# Patient Record
Sex: Male | Born: 1980 | Race: White | Hispanic: No | Marital: Single | Smoking: Current every day smoker
Health system: Southern US, Community
[De-identification: ages and names within clinical notes are randomized; demographics above are authoritative.]

## PROBLEM LIST (undated history)

## (undated) ENCOUNTER — Emergency Department (HOSPITAL_COMMUNITY): Admission: EM | Payer: Self-pay | Source: Home / Self Care

## (undated) DIAGNOSIS — K219 Gastro-esophageal reflux disease without esophagitis: Secondary | ICD-10-CM

## (undated) HISTORY — PX: WRIST SURGERY: SHX841

---
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
## 2006-10-16 ENCOUNTER — Emergency Department (HOSPITAL_COMMUNITY): Admission: EM | Admit: 2006-10-16 | Discharge: 2006-10-17 | Payer: Self-pay | Admitting: Emergency Medicine

## 2008-04-02 ENCOUNTER — Ambulatory Visit (HOSPITAL_COMMUNITY): Admission: RE | Admit: 2008-04-02 | Discharge: 2008-04-02 | Payer: Self-pay | Admitting: Unknown Physician Specialty

## 2010-08-26 ENCOUNTER — Emergency Department (HOSPITAL_COMMUNITY)
Admission: EM | Admit: 2010-08-26 | Discharge: 2010-08-26 | Disposition: A | Discharge status: Home / Self Care | Payer: Self-pay | Attending: Emergency Medicine | Admitting: Emergency Medicine

## 2010-08-26 DIAGNOSIS — R109 Unspecified abdominal pain: Secondary | ICD-10-CM | POA: Insufficient documentation

## 2014-01-06 ENCOUNTER — Emergency Department (HOSPITAL_COMMUNITY)
Admission: EM | Admit: 2014-01-06 | Discharge: 2014-01-06 | Disposition: A | Discharge status: Home / Self Care | Payer: Self-pay | Attending: Emergency Medicine | Admitting: Emergency Medicine

## 2014-01-06 ENCOUNTER — Encounter (HOSPITAL_COMMUNITY): Payer: Self-pay | Admitting: Emergency Medicine

## 2014-01-06 DIAGNOSIS — K047 Periapical abscess without sinus: Secondary | ICD-10-CM | POA: Insufficient documentation

## 2014-01-06 DIAGNOSIS — Z792 Long term (current) use of antibiotics: Secondary | ICD-10-CM | POA: Insufficient documentation

## 2014-01-06 DIAGNOSIS — K029 Dental caries, unspecified: Secondary | ICD-10-CM | POA: Insufficient documentation

## 2014-01-06 DIAGNOSIS — Z720 Tobacco use: Secondary | ICD-10-CM | POA: Insufficient documentation

## 2014-01-06 MED ORDER — HYDROCODONE-ACETAMINOPHEN 5-325 MG PO TABS: 1.0000 | ORAL_TABLET | ORAL | Status: DC | PRN

## 2014-01-06 MED ORDER — PENICILLIN V POTASSIUM 500 MG PO TABS: 500.0000 mg | ORAL_TABLET | Freq: Four times a day (QID) | ORAL | Status: DC

## 2014-01-06 NOTE — ED Notes (Signed)
Pt has had off and on abscess from 10 years, states pain got worse last night. Abscess to lower left mouth.

## 2014-01-06 NOTE — Discharge Instructions (Signed)
Take the prescribed medication as directed.  Use caution when driving, vicodin may make you drowsy. Follow-up with dentist this week as scheduled. Return to the ED for new or worsening symptoms.

## 2014-01-06 NOTE — ED Provider Notes (Signed)
CSN: [id number]     Arrival date & time 01/06/14  1631 History  This chart was scribed for non-physician practitioner, Sharilyn SitesLisa Brittan Mapel, PA-C working with Mirian MoMatthew Gentry, MD by Greggory StallionKayla Andersen, ED scribe. This patient was seen in room WTR9/WTR9 and the patient's care was started at 5:39 PM.   Chief Complaint  Patient presents with  . Dental Pain   The history is provided by the patient. No language interpreter was used.   HPI Comments: Paul Wolfe is a 33 y.o. male who presents to the Emergency Department complaining of worsening left lower dental pain with associated swelling that started 2 days ago. Reports associated fever yesterday. States he has had intermittent pain in the same area over the last several months. Pressure worsens the pain. He has used Orajel, taken advil and done salt water gargles with no relief. Pt has been told by a dentist that he needs to have his wisdom teeth removed but has not been able to afford it. He has a dentist appt on Thursday afternoon for evaluation.  History reviewed. No pertinent past medical history. Past Surgical History  Procedure Laterality Date  . Wrist surgery Left    No family history on file. History  Substance Use Topics  . Smoking status: Current Every Day Smoker  . Smokeless tobacco: Not on file  . Alcohol Use: Yes    Review of Systems  HENT: Positive for dental problem.   All other systems reviewed and are negative.  Allergies  Review of patient's allergies indicates no known allergies.  Home Medications   Prior to Admission medications   Medication Sig Start Date End Date Taking? Authorizing Provider  acetaminophen (TYLENOL) 325 MG tablet Take 650 mg by mouth every 6 (six) hours as needed for moderate pain.   Yes Historical Provider, MD  amoxicillin (AMOXIL) 875 MG tablet Take 875 mg by mouth 2 (two) times daily.   Yes Historical Provider, MD   BP 127/94  Pulse 98  Temp(Src) 98.8 F (37.1 C) (Oral)  Resp 18  SpO2  100%  Physical Exam  Nursing note and vitals reviewed. Constitutional: He is oriented to person, place, and time. He appears well-developed and well-nourished.  HENT:  Head: Normocephalic and atraumatic.  Mouth/Throat: Uvula is midline, oropharynx is clear and moist and mucous membranes are normal. Abnormal dentition. Dental abscesses and dental caries present.  Teeth largely in poor dentition, left lower wisdom tooth broken with large amount of decay, surrounding gingiva swollen and discolored concerning for abscess, handling secretions appropriately, no trismus  Eyes: Conjunctivae and EOM are normal. Pupils are equal, round, and reactive to light.  Neck: Normal range of motion.  Cardiovascular: Normal rate, regular rhythm and normal heart sounds.   Pulmonary/Chest: Effort normal and breath sounds normal. No respiratory distress. He has no wheezes.  Musculoskeletal: Normal range of motion.  Neurological: He is alert and oriented to person, place, and time.  Skin: Skin is warm and dry.  Psychiatric: He has a normal mood and affect.    ED Course  Procedures (including critical care time)  DIAGNOSTIC STUDIES: Oxygen Saturation is 100% on RA, normal by my interpretation.    COORDINATION OF CARE: 5:42 PM-Discussed treatment plan which includes an antibiotic and pain medication with pt at bedside and pt agreed to plan. Will give pt dental referrals and advised him to follow up.   Labs Review Labs Reviewed - No data to display  Imaging Review No results found.   EKG Interpretation None  MDM   Final diagnoses:  Abscess, dental   Dental pain with concern for dental abscess.  Patient afebrile and non-toxic appearing, no facial or neck swelling, airway patent.  Patient started on penicillin and vicodin for pain control.  Will FU with dentist this week as previously scheduled.  Discussed plan with patient, he/she acknowledged understanding and agreed with plan of care.  Return  precautions given for new or worsening symptoms.  I personally performed the services described in this documentation, which was scribed in my presence. The recorded information has been reviewed and is accurate.  Garlon Hatchet, PA-C 01/06/14 2005

## 2014-01-09 NOTE — ED Provider Notes (Signed)
Medical screening examination/treatment/procedure(s) were performed by non-physician practitioner and as supervising physician I was immediately available for consultation/collaboration.   EKG Interpretation None        Latiffany Harwick, MD 01/09/14 0717 

## 2014-01-15 ENCOUNTER — Emergency Department (HOSPITAL_COMMUNITY)
Admission: EM | Admit: 2014-01-15 | Discharge: 2014-01-16 | Disposition: A | Discharge status: Home / Self Care | Payer: Self-pay | Source: Home / Self Care | Attending: Emergency Medicine | Admitting: Emergency Medicine

## 2014-01-15 ENCOUNTER — Encounter (HOSPITAL_COMMUNITY): Payer: Self-pay | Admitting: Emergency Medicine

## 2014-01-15 ENCOUNTER — Emergency Department (HOSPITAL_COMMUNITY): Payer: Self-pay

## 2014-01-15 DIAGNOSIS — M272 Inflammatory conditions of jaws: Secondary | ICD-10-CM

## 2014-01-15 LAB — BASIC METABOLIC PANEL
ANION GAP: 16 — AB (ref 5–15)
BUN: 8 mg/dL (ref 6–23)
CHLORIDE: 100 meq/L (ref 96–112)
CO2: 21 mEq/L (ref 19–32)
CREATININE: 0.74 mg/dL (ref 0.50–1.35)
Calcium: 9 mg/dL (ref 8.4–10.5)
Glucose, Bld: 95 mg/dL (ref 70–99)
Potassium: 4.1 mEq/L (ref 3.7–5.3)
Sodium: 137 mEq/L (ref 137–147)

## 2014-01-15 LAB — CBC WITH DIFFERENTIAL/PLATELET
BASOS ABS: 0 10*3/uL (ref 0.0–0.1)
BASOS PCT: 0 % (ref 0–1)
EOS ABS: 0.1 10*3/uL (ref 0.0–0.7)
Eosinophils Relative: 1 % (ref 0–5)
HEMATOCRIT: 44.5 % (ref 39.0–52.0)
HEMOGLOBIN: 15.6 g/dL (ref 13.0–17.0)
Lymphocytes Relative: 13 % (ref 12–46)
Lymphs Abs: 1.9 10*3/uL (ref 0.7–4.0)
MCH: 33.3 pg (ref 26.0–34.0)
MCHC: 35.1 g/dL (ref 30.0–36.0)
MCV: 95.1 fL (ref 78.0–100.0)
MONO ABS: 1.1 10*3/uL — AB (ref 0.1–1.0)
MONOS PCT: 8 % (ref 3–12)
NEUTROS ABS: 11.4 10*3/uL — AB (ref 1.7–7.7)
NEUTROS PCT: 78 % — AB (ref 43–77)
Platelets: 245 10*3/uL (ref 150–400)
RBC: 4.68 MIL/uL (ref 4.22–5.81)
RDW: 12.1 % (ref 11.5–15.5)
WBC: 14.6 10*3/uL — ABNORMAL HIGH (ref 4.0–10.5)

## 2014-01-15 MED ORDER — MORPHINE SULFATE 4 MG/ML IJ SOLN
4.0000 mg | Freq: Once | INTRAMUSCULAR | Status: AC
  Administered 2014-01-15: 4 mg via INTRAVENOUS
  Filled 2014-01-15: qty 1

## 2014-01-15 MED ORDER — ONDANSETRON HCL 4 MG/2ML IJ SOLN
4.0000 mg | Freq: Once | INTRAMUSCULAR | Status: AC
  Administered 2014-01-15: 4 mg via INTRAVENOUS
  Filled 2014-01-15: qty 2

## 2014-01-15 MED ORDER — IOHEXOL 300 MG/ML  SOLN
100.0000 mL | Freq: Once | INTRAMUSCULAR | Status: AC | PRN
  Administered 2014-01-15: 100 mL via INTRAVENOUS

## 2014-01-15 NOTE — ED Provider Notes (Signed)
CSN: [id number]     Arrival date & time 01/15/14  2125 History  This chart was scribed for a non-physician practitioner, Emilia BeckKaitlyn Alexes Menchaca, PA-C working with Linwood DibblesJon Knapp, MD by [place]Jordan Peace, ED Scribe. The patient was seen in WTR7/WTR7. The patient's care was started at 10:07 PM.     Chief Complaint  Patient presents with  . Dental Pain      Patient is a 33 y.o. male presenting with tooth pain. The history is provided by the patient. No language interpreter was used.  Dental Pain Associated symptoms: no fever   HPI Comments: Paul Wolfe is a 33 y.o. male who presents to the Emergency Department complaining of dental pain to lower left aspect of mouth onset yesterday morning with associated swelling. Pain exacerbated with talking and swallowing. He adds that pain radiates throughout left side of face into his ear. Pt reports being seen last week for same problem. He states that he is in the process of trying to set up an appt with a dentist. He notes he has been on antibiotics for past 10 days.    History reviewed. No pertinent past medical history. Past Surgical History  Procedure Laterality Date  . Wrist surgery Left    No family history on file. History  Substance Use Topics  . Smoking status: Current Every Day Smoker  . Smokeless tobacco: Not on file  . Alcohol Use: Yes    Review of Systems  Constitutional: Negative for fever.  HENT: Positive for dental problem (lower left with associated swelling) and ear pain.   All other systems reviewed and are negative.     Allergies  Review of patient's allergies indicates no known allergies.  Home Medications   Prior to Admission medications   Medication Sig Start Date End Date Taking? Authorizing Provider  acetaminophen (TYLENOL) 325 MG tablet Take 650 mg by mouth every 6 (six) hours as needed for moderate pain.    Historical Provider, MD  amoxicillin (AMOXIL) 875 MG tablet Take 875 mg by mouth 2 (two) times daily.     Historical Provider, MD  HYDROcodone-acetaminophen (NORCO/VICODIN) 5-325 MG per tablet Take 1 tablet by mouth every 4 (four) hours as needed. 01/06/14   Garlon HatchetLisa M Sanders, PA-C  penicillin v potassium (VEETID) 500 MG tablet Take 1 tablet (500 mg total) by mouth 4 (four) times daily. 01/06/14   Garlon HatchetLisa M Sanders, PA-C   BP 124/85  Pulse 108  Temp(Src) 98.6 F (37 C) (Oral)  Resp 16  SpO2 99% Physical Exam  Nursing note and vitals reviewed. Constitutional: He is oriented to person, place, and time. He appears well-developed and well-nourished. No distress.  HENT:  Head: Normocephalic and atraumatic.  Left sided submandibular swelling with TTP. No sublingual swelling. Poor detention, no tooth tenderness to percussion.   Eyes: Conjunctivae and EOM are normal.  Neck: Neck supple. No tracheal deviation present.  Cardiovascular: Tachycardia present.   Pulmonary/Chest: Effort normal. No respiratory distress.  Musculoskeletal: Normal range of motion.  Neurological: He is alert and oriented to person, place, and time.  Skin: Skin is warm and dry.  Psychiatric: He has a normal mood and affect. His behavior is normal.    ED Course  Procedures (including critical care time) Labs Review Labs Reviewed  CBC WITH DIFFERENTIAL - Abnormal; Notable for the following:    WBC 14.6 (*)    Neutrophils Relative % 78 (*)    Neutro Abs 11.4 (*)    Monocytes Absolute 1.1 (*)  All other components within normal limits  BASIC METABOLIC PANEL - Abnormal; Notable for the following:    Anion gap 16 (*)    All other components within normal limits    No results found for this or any previous visit. No results found.   Imaging Review Ct Soft Tissue Neck W Contrast  01/16/2014   CLINICAL DATA:  Left lower dental abscess.  Swelling and fever.  EXAM: CT NECK WITH CONTRAST  TECHNIQUE: Multidetector CT imaging of the neck was performed using the standard protocol following the bolus administration of intravenous  contrast.  CONTRAST:  100mL OMNIPAQUE IOHEXOL 300 MG/ML  SOLN  COMPARISON:  None.  FINDINGS: Inflammation centered in the left submandibular region secondary to odontogenic infection. Specifically, the terminal mandibular molar on the left has a large cavity with periapical abscess which is decompressed through the inner cortex of the mandible creating a subperiosteal abscess. Abscess is 3.5 cm in length, but only 6 mm in maximal thickness/depth. It tracks anteriorly from the site of cortical breach. Reactive, non suppurative lymphadenopathy. Mild enlargement of the left submandibular and lingual glands is also reactive. There is extensive submandibular inflammation, but no definite floor of mouth inflammation. No emphysematous changes in the floor of mouth. No venous occlusion.  Numerous cavities aside from the above described.  No evidence of mass along the surfaces of the aerodigestive tract. Normal thyroid.  IMPRESSION: Odontogenic subperiosteal abscess along the inner left mandible. The offending tooth is the left lower terminal molar. Associated inflammatory changes are described above.   Electronically Signed   By: Tiburcio PeaJonathan  Watts M.D.   On: 01/16/2014 00:38     EKG Interpretation None     Medications - No data to display  10:07 PM- Treatment plan was discussed with patient who verbalizes understanding and agrees.   MDM   Final diagnoses:  Subperiosteal abscess of jaw    1:05 AM Labs show elevated WBC at 14.6 and CT of the neck shows subperiosteal abscess along the inner left mandible. I spoke with Dr. Barbette MerinoJensen, the Oral Surgeon on call, who recommends IV clindamycin here and patient will be seen in the office tomorrow morning. Patient mildly tachycardic at 108 likely due to pain. Patient is afebrile.   I personally performed the services described in this documentation, which was scribed in my presence. The recorded information has been reviewed and is accurate.   Emilia BeckKaitlyn Meta Kroenke,  PA-C 01/16/14 [phone]

## 2014-01-15 NOTE — ED Notes (Signed)
Pt reports being seen last week for same, was given abx without relief.  Pt reports L lower toothache.

## 2014-01-16 ENCOUNTER — Inpatient Hospital Stay (HOSPITAL_COMMUNITY)
Admission: RE | Admit: 2014-01-16 | Discharge: 2014-01-18 | DRG: 137 | Disposition: A | Discharge status: Home / Self Care | Payer: Self-pay | Source: Ambulatory Visit | Attending: Oral Surgery | Admitting: Oral Surgery

## 2014-01-16 ENCOUNTER — Encounter (HOSPITAL_COMMUNITY): Payer: Self-pay | Admitting: Anesthesiology

## 2014-01-16 ENCOUNTER — Ambulatory Visit: Admit: 2014-01-16 | Payer: Self-pay | Admitting: Oral Surgery

## 2014-01-16 ENCOUNTER — Ambulatory Visit (HOSPITAL_COMMUNITY): Payer: Self-pay | Admitting: Anesthesiology

## 2014-01-16 ENCOUNTER — Encounter (HOSPITAL_COMMUNITY)
Admission: RE | Disposition: A | Discharge status: Home / Self Care | Payer: Self-pay | Source: Ambulatory Visit | Attending: Oral Surgery

## 2014-01-16 ENCOUNTER — Encounter (HOSPITAL_COMMUNITY): Payer: Self-pay | Admitting: *Deleted

## 2014-01-16 DIAGNOSIS — K029 Dental caries, unspecified: Secondary | ICD-10-CM

## 2014-01-16 DIAGNOSIS — K047 Periapical abscess without sinus: Principal | ICD-10-CM | POA: Diagnosis present

## 2014-01-16 DIAGNOSIS — K122 Cellulitis and abscess of mouth: Secondary | ICD-10-CM | POA: Diagnosis present

## 2014-01-16 HISTORY — PX: TOOTH EXTRACTION: SHX859

## 2014-01-16 HISTORY — PX: INCISION AND DRAINAGE ABSCESS: SHX5864

## 2014-01-16 HISTORY — DX: Gastro-esophageal reflux disease without esophagitis: K21.9

## 2014-01-16 SURGERY — DEBRIDEMENT, MANDIBLE
Anesthesia: General | Laterality: Left

## 2014-01-16 SURGERY — DENTAL RESTORATION/EXTRACTIONS
Anesthesia: General | Site: Mouth | Laterality: Left

## 2014-01-16 MED ORDER — SODIUM CHLORIDE 0.9 % IV SOLN: 1.5000 g | Freq: Once | INTRAVENOUS | Status: DC

## 2014-01-16 MED ORDER — ACETAMINOPHEN 325 MG PO TABS: 650.0000 mg | ORAL_TABLET | Freq: Four times a day (QID) | ORAL | Status: DC | PRN

## 2014-01-16 MED ORDER — DIPHENHYDRAMINE HCL 50 MG/ML IJ SOLN: 12.5000 mg | Freq: Four times a day (QID) | INTRAMUSCULAR | Status: DC | PRN

## 2014-01-16 MED ORDER — HYDROMORPHONE HCL 1 MG/ML IJ SOLN: 0.2500 mg | INTRAMUSCULAR | Status: DC | PRN

## 2014-01-16 MED ORDER — LIDOCAINE-EPINEPHRINE 2 %-1:100000 IJ SOLN
INTRAMUSCULAR | Status: DC | PRN
  Administered 2014-01-16: 10 mL via INTRADERMAL

## 2014-01-16 MED ORDER — LACTATED RINGERS IV SOLN
INTRAVENOUS | Status: DC | PRN
  Administered 2014-01-16: 16:00:00 via INTRAVENOUS

## 2014-01-16 MED ORDER — LIDOCAINE HCL (CARDIAC) 20 MG/ML IV SOLN
INTRAVENOUS | Status: AC
  Filled 2014-01-16: qty 5

## 2014-01-16 MED ORDER — MIDAZOLAM HCL 2 MG/2ML IJ SOLN
INTRAMUSCULAR | Status: AC
  Filled 2014-01-16: qty 2

## 2014-01-16 MED ORDER — ROCURONIUM BROMIDE 50 MG/5ML IV SOLN
INTRAVENOUS | Status: AC
  Filled 2014-01-16: qty 1

## 2014-01-16 MED ORDER — FENTANYL CITRATE 0.05 MG/ML IJ SOLN
INTRAMUSCULAR | Status: DC | PRN
  Administered 2014-01-16 (×4): 50 ug via INTRAVENOUS

## 2014-01-16 MED ORDER — DIPHENHYDRAMINE HCL 12.5 MG/5ML PO ELIX: 12.5000 mg | ORAL_SOLUTION | Freq: Four times a day (QID) | ORAL | Status: DC | PRN

## 2014-01-16 MED ORDER — OXYMETAZOLINE HCL 0.05 % NA SOLN
NASAL | Status: AC
  Filled 2014-01-16: qty 15

## 2014-01-16 MED ORDER — ACETAMINOPHEN 650 MG RE SUPP: 650.0000 mg | Freq: Four times a day (QID) | RECTAL | Status: DC | PRN

## 2014-01-16 MED ORDER — SUCCINYLCHOLINE CHLORIDE 20 MG/ML IJ SOLN
INTRAMUSCULAR | Status: DC | PRN
  Administered 2014-01-16: 140 mg via INTRAVENOUS

## 2014-01-16 MED ORDER — PROPOFOL 10 MG/ML IV BOLUS
INTRAVENOUS | Status: DC | PRN
  Administered 2014-01-16: 50 mg via INTRAVENOUS
  Administered 2014-01-16: 200 mg via INTRAVENOUS

## 2014-01-16 MED ORDER — OXYCODONE HCL 5 MG/5ML PO SOLN: 5.0000 mg | Freq: Once | ORAL | Status: DC | PRN

## 2014-01-16 MED ORDER — PROPOFOL 10 MG/ML IV BOLUS
INTRAVENOUS | Status: AC
  Filled 2014-01-16: qty 20

## 2014-01-16 MED ORDER — FENTANYL CITRATE 0.05 MG/ML IJ SOLN
INTRAMUSCULAR | Status: AC
  Filled 2014-01-16: qty 5

## 2014-01-16 MED ORDER — SODIUM CHLORIDE 0.9 % IV SOLN
1.5000 g | Freq: Once | INTRAVENOUS | Status: AC
  Administered 2014-01-16: 1.5 g via INTRAVENOUS
  Filled 2014-01-16: qty 1.5

## 2014-01-16 MED ORDER — OXYCODONE HCL 5 MG PO TABS: 5.0000 mg | ORAL_TABLET | Freq: Once | ORAL | Status: DC | PRN

## 2014-01-16 MED ORDER — DEXTROSE-NACL 5-0.45 % IV SOLN
INTRAVENOUS | Status: DC
  Administered 2014-01-17 (×2): via INTRAVENOUS

## 2014-01-16 MED ORDER — HYDROMORPHONE HCL 1 MG/ML IJ SOLN
1.0000 mg | INTRAMUSCULAR | Status: DC | PRN
  Administered 2014-01-16 – 2014-01-17 (×3): 1 mg via INTRAVENOUS
  Filled 2014-01-16 (×3): qty 1

## 2014-01-16 MED ORDER — LIDOCAINE-EPINEPHRINE 2 %-1:100000 IJ SOLN
INTRAMUSCULAR | Status: AC
  Filled 2014-01-16: qty 1

## 2014-01-16 MED ORDER — CLINDAMYCIN PHOSPHATE 600 MG/50ML IV SOLN
600.0000 mg | Freq: Once | INTRAVENOUS | Status: AC
Start: 2014-01-16 — End: 2014-01-16
  Administered 2014-01-16: 600 mg via INTRAVENOUS
  Filled 2014-01-16: qty 50

## 2014-01-16 MED ORDER — HYDROCODONE-ACETAMINOPHEN 5-325 MG PO TABS
1.0000 | ORAL_TABLET | ORAL | Status: DC | PRN
  Administered 2014-01-17: 1 via ORAL
  Administered 2014-01-17 – 2014-01-18 (×4): 2 via ORAL
  Filled 2014-01-16: qty 2
  Filled 2014-01-16: qty 1
  Filled 2014-01-16 (×3): qty 2

## 2014-01-16 MED ORDER — DEXAMETHASONE SODIUM PHOSPHATE 4 MG/ML IJ SOLN
INTRAMUSCULAR | Status: DC | PRN
  Administered 2014-01-16: 4 mg via INTRAVENOUS

## 2014-01-16 MED ORDER — ONDANSETRON HCL 4 MG/2ML IJ SOLN: 4.0000 mg | Freq: Four times a day (QID) | INTRAMUSCULAR | Status: DC | PRN

## 2014-01-16 MED ORDER — MIDAZOLAM HCL 5 MG/5ML IJ SOLN
INTRAMUSCULAR | Status: DC | PRN
  Administered 2014-01-16: 2 mg via INTRAVENOUS

## 2014-01-16 MED ORDER — MORPHINE SULFATE 4 MG/ML IJ SOLN
4.0000 mg | Freq: Once | INTRAMUSCULAR | Status: AC
  Administered 2014-01-16: 4 mg via INTRAVENOUS
  Filled 2014-01-16: qty 1

## 2014-01-16 MED ORDER — SODIUM CHLORIDE 0.9 % IV SOLN
3.0000 g | Freq: Four times a day (QID) | INTRAVENOUS | Status: DC
  Administered 2014-01-16 – 2014-01-18 (×6): 3 g via INTRAVENOUS
  Filled 2014-01-16 (×9): qty 3

## 2014-01-16 MED ORDER — ONDANSETRON HCL 4 MG/2ML IJ SOLN
INTRAMUSCULAR | Status: DC | PRN
  Administered 2014-01-16: 4 mg via INTRAVENOUS

## 2014-01-16 MED ORDER — LACTATED RINGERS IV SOLN: INTRAVENOUS | Status: DC

## 2014-01-16 MED ORDER — SODIUM CHLORIDE 0.9 % IR SOLN
Status: DC | PRN
  Administered 2014-01-16: 1000 mL

## 2014-01-16 SURGICAL SUPPLY — 54 items
BLADE 10 SAFETY STRL DISP (BLADE) ×1 IMPLANT
BLADE SURG 15 STRL LF DISP TIS (BLADE) ×1 IMPLANT
BLADE SURG 15 STRL SS (BLADE)
BNDG CONFORM 2 STRL LF (GAUZE/BANDAGES/DRESSINGS) ×1 IMPLANT
BUR CROSS CUT FISSURE 1.6 (BURR) ×1 IMPLANT
BUR CROSS CUT FISSURE 1.6MM (BURR) ×1
BUR EGG ELITE 4.0 (BURR) ×1 IMPLANT
BUR EGG ELITE 4.0MM (BURR)
CANISTER SUCTION 2500CC (MISCELLANEOUS) ×3 IMPLANT
COVER SURGICAL LIGHT HANDLE (MISCELLANEOUS) ×6 IMPLANT
CRADLE DONUT ADULT HEAD (MISCELLANEOUS) IMPLANT
DRAIN PENROSE 1/4X12 LTX STRL (WOUND CARE) ×3 IMPLANT
DRSG PAD ABDOMINAL 8X10 ST (GAUZE/BANDAGES/DRESSINGS) IMPLANT
ELECT COATED BLADE 2.86 ST (ELECTRODE) IMPLANT
ELECT REM PT RETURN 9FT ADLT (ELECTROSURGICAL) ×3
ELECTRODE REM PT RTRN 9FT ADLT (ELECTROSURGICAL) ×1 IMPLANT
GAUZE PACKING FOLDED 2  STR (GAUZE/BANDAGES/DRESSINGS) ×2
GAUZE PACKING FOLDED 2 STR (GAUZE/BANDAGES/DRESSINGS) ×1 IMPLANT
GAUZE SPONGE 4X4 12PLY STRL (GAUZE/BANDAGES/DRESSINGS) ×2 IMPLANT
GAUZE SPONGE 4X4 16PLY XRAY LF (GAUZE/BANDAGES/DRESSINGS) ×1 IMPLANT
GLOVE BIO SURGEON STRL SZ 6.5 (GLOVE) ×3 IMPLANT
GLOVE BIO SURGEON STRL SZ7.5 (GLOVE) ×3 IMPLANT
GLOVE BIO SURGEONS STRL SZ 6.5 (GLOVE) ×2
GLOVE BIOGEL PI IND STRL 7.0 (GLOVE) ×1 IMPLANT
GLOVE BIOGEL PI INDICATOR 7.0 (GLOVE) ×2
GOWN STRL REUS W/ TWL LRG LVL3 (GOWN DISPOSABLE) ×1 IMPLANT
GOWN STRL REUS W/ TWL XL LVL3 (GOWN DISPOSABLE) ×1 IMPLANT
GOWN STRL REUS W/TWL LRG LVL3 (GOWN DISPOSABLE) ×3
GOWN STRL REUS W/TWL XL LVL3 (GOWN DISPOSABLE) ×3
KIT BASIN OR (CUSTOM PROCEDURE TRAY) ×3 IMPLANT
KIT ROOM TURNOVER OR (KITS) ×3 IMPLANT
MARKER SKIN DUAL TIP RULER LAB (MISCELLANEOUS) ×1 IMPLANT
NDL HYPO 25GX1X1/2 BEV (NEEDLE) ×1 IMPLANT
NEEDLE 22X1 1/2 (OR ONLY) (NEEDLE) ×1 IMPLANT
NEEDLE HYPO 25GX1X1/2 BEV (NEEDLE) IMPLANT
NS IRRIG 1000ML POUR BTL (IV SOLUTION) ×3 IMPLANT
PACK SURGICAL SETUP 50X90 (CUSTOM PROCEDURE TRAY) ×1 IMPLANT
PAD ARMBOARD 7.5X6 YLW CONV (MISCELLANEOUS) ×6 IMPLANT
PENCIL BUTTON HOLSTER BLD 10FT (ELECTRODE) IMPLANT
SPONGE SURGIFOAM ABS GEL 12-7 (HEMOSTASIS) IMPLANT
SUT CHROMIC 3 0 PS 2 (SUTURE) ×2 IMPLANT
SUT ETHILON 2 0 FS 18 (SUTURE) ×1 IMPLANT
SUT SILK 3 0 SH 30 (SUTURE) ×2 IMPLANT
SWAB COLLECTION DEVICE MRSA (MISCELLANEOUS) ×3 IMPLANT
SYR BULB IRRIGATION 50ML (SYRINGE) ×1 IMPLANT
SYR CONTROL 10ML LL (SYRINGE) ×1 IMPLANT
TAPE CLOTH SURG 4X10 WHT LF (GAUZE/BANDAGES/DRESSINGS) ×2 IMPLANT
TOWEL OR 17X24 6PK STRL BLUE (TOWEL DISPOSABLE) ×3 IMPLANT
TRAY ENT MC OR (CUSTOM PROCEDURE TRAY) ×3 IMPLANT
TUBE ANAEROBIC SPECIMEN COL (MISCELLANEOUS) ×3 IMPLANT
TUBE CONNECTING 12'X1/4 (SUCTIONS)
TUBE CONNECTING 12X1/4 (SUCTIONS) ×1 IMPLANT
TUBING IRRIGATION (MISCELLANEOUS) ×3 IMPLANT
YANKAUER SUCT BULB TIP NO VENT (SUCTIONS) ×3 IMPLANT

## 2014-01-16 NOTE — ED Notes (Signed)
Mother driving pt home 

## 2014-01-16 NOTE — OR Nursing (Addendum)
Throat pack in at 1624. Out at- 1637

## 2014-01-16 NOTE — Anesthesia Procedure Notes (Signed)
Procedure Name: Intubation Date/Time: 01/16/2014 4:17 PM Performed by: Lovie CholOCK, Davan Nawabi K Pre-anesthesia Checklist: Patient identified, Emergency Drugs available, Suction available, Patient being monitored and Timeout performed Patient Re-evaluated:Patient Re-evaluated prior to inductionOxygen Delivery Method: Circle system utilized Preoxygenation: Pre-oxygenation with 100% oxygen Intubation Type: IV induction Grade View: Grade II Tube type: Oral Tube size: 7.0 mm Number of attempts: 1 Airway Equipment and Method: Stylet and Video-laryngoscopy Placement Confirmation: positive ETCO2,  CO2 detector,  breath sounds checked- equal and bilateral and ETT inserted through vocal cords under direct vision Secured at: 22 cm Tube secured with: Tape Dental Injury: Teeth and Oropharynx as per pre-operative assessment

## 2014-01-16 NOTE — Op Note (Signed)
01/16/2014  4:40 PM  PATIENT:  Barnabas HarriesJacob D Marian  33 y.o. male  PRE-OPERATIVE DIAGNOSIS:  Left Sub-Mandibular space abscess  Abscessed teeth # 16, 17, 18  POST-OPERATIVE DIAGNOSIS:  SAME  PROCEDURE:  Procedure(s): DENTAL EXTRACTION TEETH #'s 16, 17, 18 INCISION AND DRAINAGE Left submandibular space ABSCESS  SURGEON:  Surgeon(s): Georgia LopesScott M Jaria Conway, DDS  ANESTHESIA:   local and general  EBL:  minimal  DRAINS: none   SPECIMEN:  No Specimen  COUNTS:  YES  PLAN OF CARE: Discharge to home after PACU  PATIENT DISPOSITION:  PACU - hemodynamically stable.   PROCEDURE DETAILS: Dictation # [id number]  Georgia LopesScott M. Houa Ackert, DMD 01/16/2014 4:40 PM

## 2014-01-16 NOTE — Discharge Instructions (Signed)
Call Dr. Randa EvensJensen's office in the morning to be seen tomorrow. Dr. Barbette MerinoJensen is aware you will be coming to the office tomorrow.

## 2014-01-16 NOTE — Transfer of Care (Signed)
Immediate Anesthesia Transfer of Care Note  Patient: Paul Wolfe  Procedure(s) Performed: Procedure(s): DENTAL RESTORATION/EXTRACTIONS (Left) INCISION AND DRAINAGE ABSCESS (Left)  Patient Location: PACU  Anesthesia Type:General  Level of Consciousness: awake, oriented and patient cooperative  Airway & Oxygen Therapy: Patient Spontanous Breathing and Patient connected to nasal cannula oxygen  Post-op Assessment: Report given to PACU RN and Post -op Vital signs reviewed and stable  Post vital signs: Reviewed  Complications: No apparent anesthesia complications

## 2014-01-16 NOTE — H&P (Signed)
HISTORY AND PHYSICAL  Paul HarriesJacob D Wolfe is a 33 y.o. male patient with CC: Left jaw and neck swelling.  HPI: Patient went to ER 1.5 weeks ago and received antibiotics. Swelling returned and [facility]. Scheduled for extractions with Incision and Drainage today in office but patient complained of difficulty breathing as swelling increased. [facility].  No diagnosis found.  No past medical history on file.  No current facility-administered medications for this encounter.   No Known Allergies Active Problems:   * No active hospital problems. *  Vitals: There were no vitals taken for this visit. Lab results: Results for orders placed during the hospital encounter of 01/15/14 (from the past 24 hour(s))  CBC WITH DIFFERENTIAL     Status: Abnormal   Collection Time    01/15/14 10:35 PM      Result Value Ref Range   WBC 14.6 (*) 4.0 - 10.5 K/uL   RBC 4.68  4.22 - 5.81 MIL/uL   Hemoglobin 15.6  13.0 - 17.0 g/dL   HCT 09.844.5  11.939.0 - 14.752.0 %   MCV 95.1  78.0 - 100.0 fL   MCH 33.3  26.0 - 34.0 pg   MCHC 35.1  30.0 - 36.0 g/dL   RDW 82.912.1  56.211.5 - 13.015.5 %   Platelets 245  150 - 400 K/uL   Neutrophils Relative % 78 (*) 43 - 77 %   Neutro Abs 11.4 (*) 1.7 - 7.7 K/uL   Lymphocytes Relative 13  12 - 46 %   Lymphs Abs 1.9  0.7 - 4.0 K/uL   Monocytes Relative 8  3 - 12 %   Monocytes Absolute 1.1 (*) 0.1 - 1.0 K/uL   Eosinophils Relative 1  0 - 5 %   Eosinophils Absolute 0.1  0.0 - 0.7 K/uL   Basophils Relative 0  0 - 1 %   Basophils Absolute 0.0  0.0 - 0.1 K/uL  BASIC METABOLIC PANEL     Status: Abnormal   Collection Time    01/15/14 10:35 PM      Result Value Ref Range   Sodium 137  137 - 147 mEq/L   Potassium 4.1  3.7 - 5.3 mEq/L   Chloride 100  96 - 112 mEq/L   CO2 21  19 - 32 mEq/L   Glucose, Bld 95  70 - 99 mg/dL   BUN 8  6 - 23 mg/dL   Creatinine, Ser 8.650.74  0.50 - 1.35 mg/dL   Calcium 9.0  8.4 - 78.410.5 mg/dL   GFR calc non Af Amer  >90  >90 mL/min   GFR calc Af Amer >90  >90 mL/min   Anion gap 16 (*) 5 - 15   Radiology Results: Ct Soft Tissue Neck W Contrast  01/16/2014   CLINICAL DATA:  Left lower dental abscess.  Swelling and fever.  EXAM: CT NECK WITH CONTRAST  TECHNIQUE: Multidetector CT imaging of the neck was performed using the standard protocol following the bolus administration of intravenous contrast.  CONTRAST:  100mL OMNIPAQUE IOHEXOL 300 MG/ML  SOLN  COMPARISON:  None.  FINDINGS: Inflammation centered in the left submandibular region secondary to odontogenic infection. Specifically, the terminal mandibular molar on the left has a large cavity with periapical abscess which is decompressed through the inner cortex of the mandible creating a subperiosteal abscess. Abscess is 3.5 cm in length, but only 6 mm in maximal thickness/depth. It tracks anteriorly from the site  of cortical breach. Reactive, non suppurative lymphadenopathy. Mild enlargement of the left submandibular and lingual glands is also reactive. There is extensive submandibular inflammation, but no definite floor of mouth inflammation. No emphysematous changes in the floor of mouth. No venous occlusion.  Numerous cavities aside from the above described.  No evidence of mass along the surfaces of the aerodigestive tract. Normal thyroid.  IMPRESSION: Odontogenic subperiosteal abscess along the inner left mandible. The offending tooth is the left lower terminal molar. Associated inflammatory changes are described above.   Electronically Signed   By: Tiburcio PeaJonathan  Watts M.D.   On: 01/16/2014 00:38   General appearance: alert, cooperative and mild distress Head: Normocephalic, without obvious abnormality, atraumatic Eyes: negative Nose: Nares normal. Septum midline. Mucosa normal. No drainage or sinus tenderness. Throat: lips, mucosa, and tongue normal; teeth and gums normal and Gross decay left upper/lower molars. Mild fluctuance left buccal space. Trismus to 15mm.  No midline uvula shift. Pharynx clear. Neck: supple, symmetrical, trachea midline, thyroid not enlarged, symmetric, no tenderness/mass/nodules and Marked left submandibular swelling. Positive left neck tenderness to clavicle, no fluctuance. Resp: clear to auscultation bilaterally Cardio: regular rate and rhythm, S1, S2 normal, no murmur, click, rub or gallop  Assessment: Left Submandibular space infection, Non-restorable abscessed teeth.  Plan: Dental extractions. Incision and drainage. IV antibiotics. General anesthesia. Admit for IV antibiotics and post-operative management.   Paul Wolfe M 01/16/2014

## 2014-01-16 NOTE — Anesthesia Preprocedure Evaluation (Addendum)
Anesthesia Evaluation  Patient identified by MRN, date of birth, ID band Patient awake    Reviewed: Allergy & Precautions, H&P , NPO status , Patient's Chart, lab work & pertinent test results  History of Anesthesia Complications Negative for: history of anesthetic complications  Airway Mallampati: IV TM Distance: >3 FB Neck ROM: Full  Mouth opening: Limited Mouth Opening  Dental  (+) Poor Dentition, Dental Advisory Given   Pulmonary neg shortness of breath, neg sleep apnea, neg COPDneg recent URI, Current Smoker,  breath sounds clear to auscultation        Cardiovascular negative cardio ROS  Rhythm:Regular     Neuro/Psych negative neurological ROS  negative psych ROS   GI/Hepatic Neg liver ROS, (+)     substance abuse  marijuana use,   Endo/Other  negative endocrine ROS  Renal/GU negative Renal ROS  negative genitourinary   Musculoskeletal negative musculoskeletal ROS (+)   Abdominal   Peds  Hematology negative hematology ROS (+)   Anesthesia Other Findings Smokes MJ, ETOH use, left dental abscess   Reproductive/Obstetrics                          Anesthesia Physical Anesthesia Plan  ASA: II  Anesthesia Plan: General   Post-op Pain Management:    Induction: Intravenous  Airway Management Planned: Oral ETT  Additional Equipment: None  Intra-op Plan:   Post-operative Plan: Extubation in OR  Informed Consent: I have reviewed the patients History and Physical, chart, labs and discussed the procedure including the risks, benefits and alternatives for the proposed anesthesia with the patient or authorized representative who has indicated his/her understanding and acceptance.   Dental advisory given  Plan Discussed with: CRNA, Anesthesiologist and Surgeon  Anesthesia Plan Comments:         Anesthesia Quick Evaluation

## 2014-01-16 NOTE — ED Provider Notes (Signed)
Medical screening examination/treatment/procedure(s) were performed by non-physician practitioner and as supervising physician I was immediately available for consultation/collaboration.   Linwood DibblesJon Betsey Sossamon, MD 01/16/14 [phone]

## 2014-01-16 NOTE — ED Notes (Signed)
Pt reports he is leaving with ALL belongings he arrive with.

## 2014-01-17 LAB — CBC
HEMATOCRIT: 41.1 % (ref 39.0–52.0)
Hemoglobin: 14.5 g/dL (ref 13.0–17.0)
MCH: 33.3 pg (ref 26.0–34.0)
MCHC: 35.3 g/dL (ref 30.0–36.0)
MCV: 94.3 fL (ref 78.0–100.0)
Platelets: 236 10*3/uL (ref 150–400)
RBC: 4.36 MIL/uL (ref 4.22–5.81)
RDW: 11.8 % (ref 11.5–15.5)
WBC: 16.4 10*3/uL — AB (ref 4.0–10.5)

## 2014-01-17 NOTE — Progress Notes (Addendum)
Warm NS rinses orally q 4 hours and prn after meals. Pt reports pain levels of 7-10 today, but is lying calmly in th bed without signs of severe distress.

## 2014-01-17 NOTE — Anesthesia Postprocedure Evaluation (Signed)
  Anesthesia Post-op Note  Patient: Paul Wolfe  Procedure(s) Performed: Procedure(s): DENTAL RESTORATION/EXTRACTIONS (Left) INCISION AND DRAINAGE ABSCESS (Left)  Patient Location: PACU  Anesthesia Type:General  Level of Consciousness: awake  Airway and Oxygen Therapy: Patient Spontanous Breathing  Post-op Pain: mild  Post-op Assessment: Post-op Vital signs reviewed, Patient's Cardiovascular Status Stable, Respiratory Function Stable, Patent Airway, No signs of Nausea or vomiting and Pain level controlled  Post-op Vital Signs: Reviewed  Last Vitals:  Filed Vitals:   01/17/14 0631  BP: 116/80  Pulse: 68  Temp: 36.8 C  Resp: 20    Complications: No apparent anesthesia complications

## 2014-01-17 NOTE — Progress Notes (Signed)
Paul Wolfe PROGRESS NOTE:   SUBJECTIVE: Feeling a little better  OBJECTIVE:  Vitals: Blood pressure 116/80, pulse 68, temperature 98.3 F (36.8 C), temperature source Oral, resp. rate 20, height 5' 11.75" (1.822 Wolfe), weight 64.411 kg (142 lb), SpO2 100.00%. Lab results: Results for orders placed during the hospital encounter of 01/16/14 (from the past 24 hour(s))  CULTURE, ROUTINE-ABSCESS     Status: None   Collection Time    01/16/14  4:29 PM      Result Value Ref Range   Specimen Description ABSCESS MOUTH     Special Requests PATIENT ON FOLLOWING UNICIN LEFT MANDIBLE ABSCESS     Gram Stain PENDING     Culture       Value: NO GROWTH 1 DAY     Performed at [facility]   Report Status PENDING    CBC     Status: Abnormal   Collection Time    01/17/14  5:54 AM      Result Value Ref Range   WBC 16.4 (*) 4.0 - 10.5 K/uL   RBC 4.36  4.22 - 5.81 MIL/uL   Hemoglobin 14.5  13.0 - 17.0 g/dL   HCT 40.941.1  81.139.0 - 91.452.0 %   MCV 94.3  78.0 - 100.0 fL   MCH 33.3  26.0 - 34.0 pg   MCHC 35.3  30.0 - 36.0 g/dL   RDW 78.211.8  95.611.5 - 21.315.5 %   Platelets 236  150 - 400 K/uL   Radiology Results: Ct Soft Tissue Neck W Contrast  01/16/2014   CLINICAL DATA:  Left lower dental abscess.  Swelling and fever.  EXAM: CT NECK WITH CONTRAST  TECHNIQUE: Multidetector CT imaging of the neck was performed using the standard protocol following the bolus administration of intravenous contrast.  CONTRAST:  100mL OMNIPAQUE IOHEXOL 300 MG/ML  SOLN  COMPARISON:  None.  FINDINGS: Inflammation centered in the left submandibular region secondary to odontogenic infection. Specifically, the terminal mandibular molar on the left has a large cavity with periapical abscess which is decompressed through the inner cortex of the mandible creating a subperiosteal abscess. Abscess is 3.5 cm in length, but only 6 mm in maximal thickness/depth. It tracks anteriorly from the site of cortical breach. Reactive, non suppurative  lymphadenopathy. Mild enlargement of the left submandibular and lingual glands is also reactive. There is extensive submandibular inflammation, but no definite floor of mouth inflammation. No emphysematous changes in the floor of mouth. No venous occlusion.  Numerous cavities aside from the above described.  No evidence of mass along the surfaces of the aerodigestive tract. Normal thyroid.  IMPRESSION: Odontogenic subperiosteal abscess along the inner left mandible. The offending tooth is the left lower terminal molar. Associated inflammatory changes are described above.   Electronically Signed   By: Tiburcio PeaJonathan  Watts Wolfe.D.   On: 01/16/2014 00:38   General appearance: alert, cooperative and no distress Head: Normocephalic, without obvious abnormality, atraumatic Throat: Trismus to 15mm. Pharynx clear. Surgical areas wnl.  Neck: no adenopathy, supple, symmetrical, trachea midline and Moderate left submandibular edema. Copious drainage. Penrose intact. Resp: clear to auscultation bilaterally Cardio: regular rate and rhythm, S1, S2 normal, no murmur, click, rub or gallop  ASSESSMENT: Improving s/p Incision and drainage, dental extractions  PLAN: continue IV antibiotics. Consider D/C am or Sunday.   Paul Wolfe 01/17/2014

## 2014-01-17 NOTE — Progress Notes (Signed)
UR completed 

## 2014-01-17 NOTE — Op Note (Signed)
NAME:  Paul Wolfe, Paul               ACCOUNT NO.:  [id number]  MEDICAL RECORD NO.:  [id number]  LOCATION:  [facility]                         FACILITY:  [facility]  PHYSICIAN:  Georgia LopesScott M. Hyman Crossan, M.D.  DATE OF BIRTH:  September 17, 1980  DATE OF PROCEDURE:  01/16/2014 DATE OF DISCHARGE:                              OPERATIVE REPORT   PREOPERATIVE DIAGNOSIS:  Left submandibular space abscess, teeth #16, 17, 18.  POSTOPERATIVE DIAGNOSIS:  Left submandibular space abscess, teeth #16, 17, 18.  PROCEDURES:  Extraction of teeth #16, 17, 18; incision and drainage of left submandibular space abscess.  SURGEON:  Georgia LopesScott M. Teller Wakefield, M.D.  ANESTHESIA:  General and local.  DESCRIPTION OF PROCEDURE:  The patient was taken to the operating room, placed on the table in supine position.  General anesthesia was administered intravenously and an oral endotracheal tube was placed and secured.  The eyes were protected.  The patient was prepped and draped for the procedure.  Time-out was performed.  A 2% lidocaine was infiltrated in the crease of the left submandibular region below the most inferior portion of the swelling and 2% lidocaine with 1:100,000 epinephrine was infiltrated along the area of incision and then, inferior alveolar block was given and buccal infiltration of the left mandible, total of 10 mL was utilized.  The posterior pharynx was suctioned.  A throat pack was placed.  Then, a Cryer elevator was placed in the skin incision and carried up to the inferior border of the mandible upon going to the lingual aspect of the mandible, significant amount of purulence, foul-smelling pus was evacuated.  Cultures were taken for anaerobic, aerobic, and Gram stain.  The Cryer was used to spread into all perceived loculations of the infection.  The area was irrigated and then attention was turned to the oral cavity.  Additional local anesthesia was administered around tooth #16, approximately 3 mL. A 15-blade was  used to make an incision around teeth #16, 17 and 18. The teeth were elevated.  The lower teeth were removed with the dental forceps.  The tooth #17 fractured, but was removed with the 301 elevator.  Tooth #16 fractured and required additional bone removal with the Stryker handpiece, wanted to remove the palatal roof.  Then, the oral cavity was inspected, and irrigated and suctioned.  Throat pack was removed.  The 0.25-inch Penrose drain was placed through the skin incision up to the inferior border of the mandible and sutured to the skin with 3-0 silk.  The patient was then awakened, taken to the recovery room, breathing spontaneously in good condition.  ESTIMATED BLOOD LOSS:  Minimum.  COMPLICATIONS:  None.  SPECIMENS:  None.  CULTURES:  Aerobic, anaerobic and Gram stains of left mandible abscess.     Georgia LopesScott M. Kristelle Cavallaro, M.D.     SMJ/MEDQ  D:  01/16/2014  T:  01/17/2014  Job:  [id number]

## 2014-01-17 NOTE — Addendum Note (Signed)
Addendum created 01/17/14 1324 by Lovie CholJennifer K Reon Hunley, CRNA   Modules edited: Clinical Notes   Clinical Notes:  File: [id number]

## 2014-01-18 LAB — CBC
HEMATOCRIT: 38.5 % — AB (ref 39.0–52.0)
HEMOGLOBIN: 13.4 g/dL (ref 13.0–17.0)
MCH: 33.1 pg (ref 26.0–34.0)
MCHC: 34.8 g/dL (ref 30.0–36.0)
MCV: 95.1 fL (ref 78.0–100.0)
Platelets: 233 10*3/uL (ref 150–400)
RBC: 4.05 MIL/uL — AB (ref 4.22–5.81)
RDW: 12.1 % (ref 11.5–15.5)
WBC: 11.2 10*3/uL — ABNORMAL HIGH (ref 4.0–10.5)

## 2014-01-18 MED ORDER — CHLORHEXIDINE GLUCONATE 0.12 % MT SOLN: 15.0000 mL | Freq: Two times a day (BID) | OROMUCOSAL | Status: AC

## 2014-01-18 MED ORDER — ONDANSETRON HCL 4 MG PO TABS: 4.0000 mg | ORAL_TABLET | Freq: Three times a day (TID) | ORAL | Status: AC | PRN

## 2014-01-18 MED ORDER — OXYCODONE-ACETAMINOPHEN 5-325 MG PO TABS: 1.0000 | ORAL_TABLET | ORAL | Status: AC | PRN

## 2014-01-18 MED ORDER — AMOXICILLIN-POT CLAVULANATE 875-125 MG PO TABS: 1.0000 | ORAL_TABLET | Freq: Two times a day (BID) | ORAL | Status: AC

## 2014-01-18 NOTE — Discharge Planning (Addendum)
Copy of AVS and rx x3 to pt who verbalizes understanding.  Will d/c to private car home with all personal belongings, acomp. By family.  DC at 1005.

## 2014-01-18 NOTE — Discharge Summary (Signed)
Patient ID: Paul Wolfe MRN: [id number] DOB/AGE: 34/12/1980 34 y.o.  Admit date: 01/16/2014 Discharge date: 01/18/2014  Admission Diagnoses:Submandibular Space Infection, Abscessed teeth, Dental caries  Discharge Diagnoses:  Active Problems:   Submandibular space infection   Discharged Condition: good  Hospital Course: Admitted post-operatively after Incision and drainage of submandibular space infection with extraction of abscessed teeth. Received wound care, IV antibiotics and pain management.   Consults: None  Significant Diagnostic Studies: microbiology: wound culture: positive for gram negative rods  Treatments: IV hydration and antibiotics: Unasyn. IV pain management: Dilaudid.  Discharge Exam: Blood pressure 105/64, pulse 62, temperature 98.1 F (36.7 C), temperature source Oral, resp. rate 19, height 5' 11.75" (1.822 m), weight 64.411 kg (142 lb), SpO2 98.00%. General appearance: alert, cooperative and no distress Head: Normocephalic, without obvious abnormality, atraumatic Eyes: negative Throat: Mild trismus to 25mm. Pharynx clear. Extraction sites without drainage. Neck: no adenopathy, supple, symmetrical, trachea midline, thyroid not enlarged, symmetric, no tenderness/mass/nodules and Minimal drainage left neck. Drain intact. Non-tender below incision site to clavicle. Resp: clear to auscultation bilaterally Cardio: regular rate and rhythm, S1, S2 normal, no murmur, click, rub or gallop  Disposition: 01-Home or Self Care  Discharge Instructions   Call MD for:  difficulty breathing, headache or visual disturbances    Complete by:  As directed      Call MD for:  persistant nausea and vomiting    Complete by:  As directed      Call MD for:  severe uncontrolled pain    Complete by:  As directed      Call MD for:  temperature >100.4    Complete by:  As directed      Diet - low sodium heart healthy    Complete by:  As directed   Soft Diet. Advance as tolerated.      Discharge instructions    Complete by:  As directed   Warm salt water rinses 4-5 times per day. Change dressing as needed.  Soft diet Warm compresses to left jaw. Call [phone] for appointment time on Monday for drain removal.     Increase activity slowly    Complete by:  As directed             Medication List    STOP taking these medications       penicillin v potassium 500 MG tablet  Commonly known as:  VEETID      TAKE these medications       acetaminophen 325 MG tablet  Commonly known as:  TYLENOL  Take 650 mg by mouth every 6 (six) hours as needed for moderate pain.     amoxicillin-clavulanate 875-125 MG per tablet  Commonly known as:  AUGMENTIN  Take 1 tablet by mouth 2 (two) times daily.     chlorhexidine 0.12 % solution  Commonly known as:  PERIDEX  Use as directed 15 mLs in the mouth or throat 2 (two) times daily.     ibuprofen 200 MG tablet  Commonly known as:  ADVIL,MOTRIN  Take 400 mg by mouth every 6 (six) hours as needed for moderate pain.     ondansetron 4 MG tablet  Commonly known as:  ZOFRAN  Take 1 tablet (4 mg total) by mouth every 8 (eight) hours as needed for nausea or vomiting.     oxyCODONE-acetaminophen 5-325 MG per tablet  Commonly known as:  PERCOCET  Take 1-2 tablets by mouth every 4 (four) hours as needed for severe pain.  Signed: Georgia LopesJENSEN,Artemisia Auvil M 01/18/2014, 6:30 AM

## 2014-01-20 LAB — CULTURE, ROUTINE-ABSCESS

## 2014-01-21 ENCOUNTER — Encounter (HOSPITAL_COMMUNITY): Payer: Self-pay | Admitting: Oral Surgery

## 2014-01-21 LAB — ANAEROBIC CULTURE

## 2016-05-24 IMAGING — CT CT NECK W/ CM
2 series · 10 of 14 positions shown, 12 images · IV contrast (OMNIPAQUE 300)
Comparison: None.

CLINICAL DATA: Left lower dental abscess.  Swelling and fever.

EXAM:
CT NECK WITH CONTRAST
TECHNIQUE: Multidetector CT imaging of the neck was performed using the
standard protocol following the bolus administration of intravenous
contrast.
CONTRAST:  100mL OMNIPAQUE IOHEXOL 300 MG/ML  SOLN

[Series 2: neck with st · axial · 0.29mm/px · z∈[-175,-55]mm · 5 of 90 slices shown]
[im 15/90  bone]
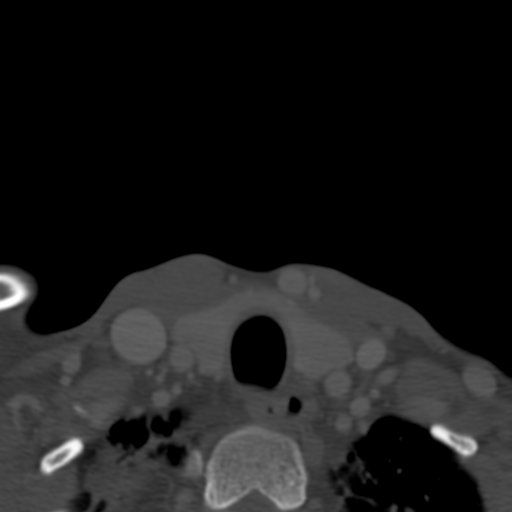
[im 30/90  bone]
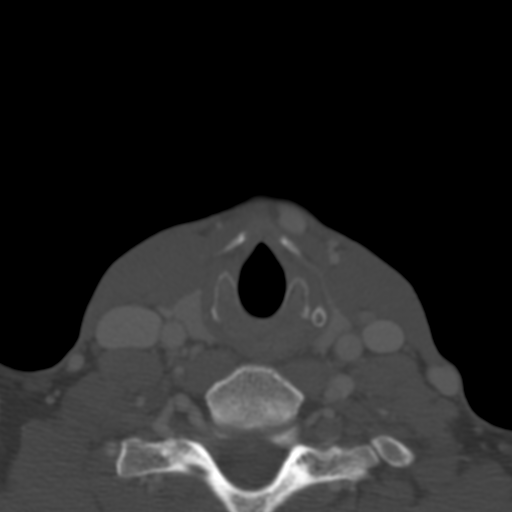
[im 45/90  bone]
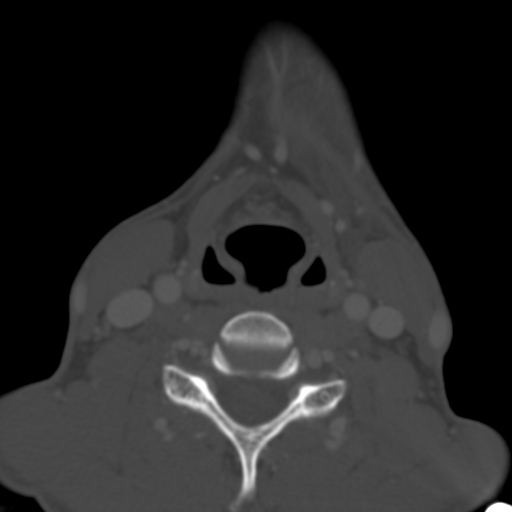
[im 60/90  bone]
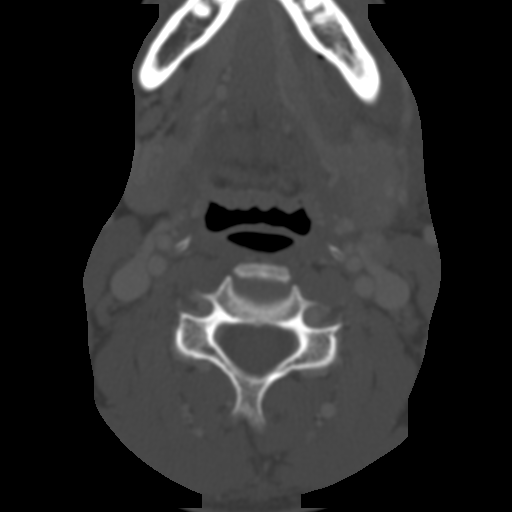
[im 75/90  bone]
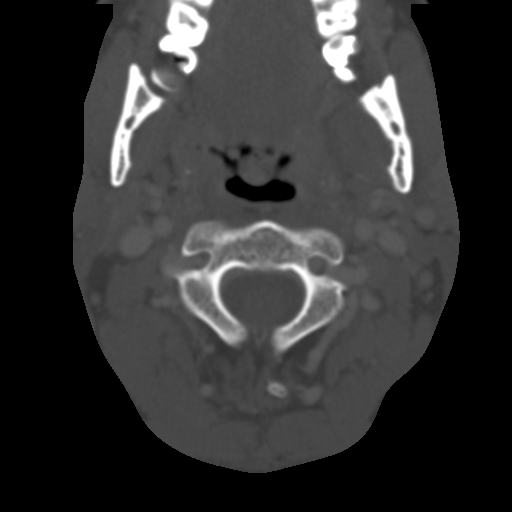

[Series 5: axial recons · axial · 0.26mm/px · z∈[-195,-83]mm · 5 of 90 slices shown, 7 images]
[im 15/90  soft-tissue]
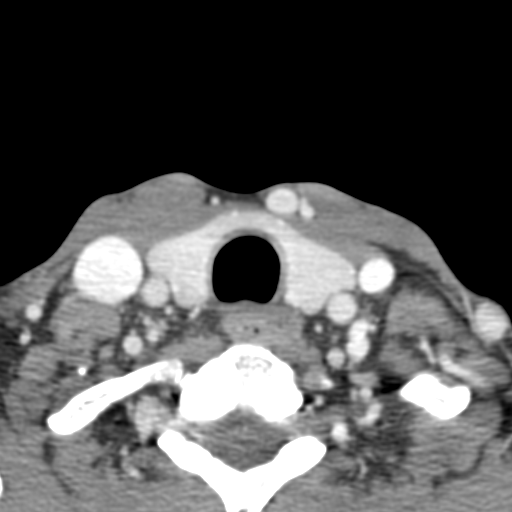
[im 15/90  bone]
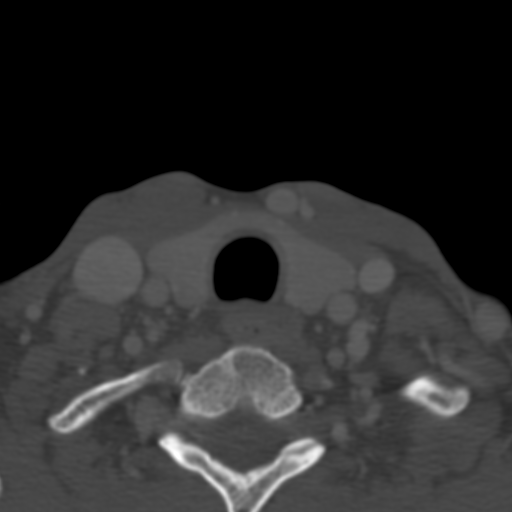
[im 30/90  bone]
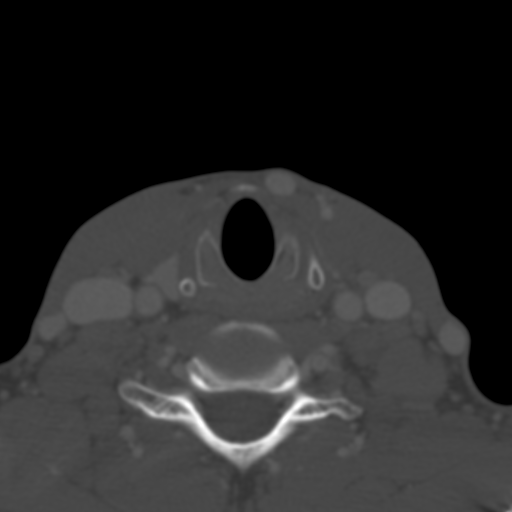
[im 45/90  bone]
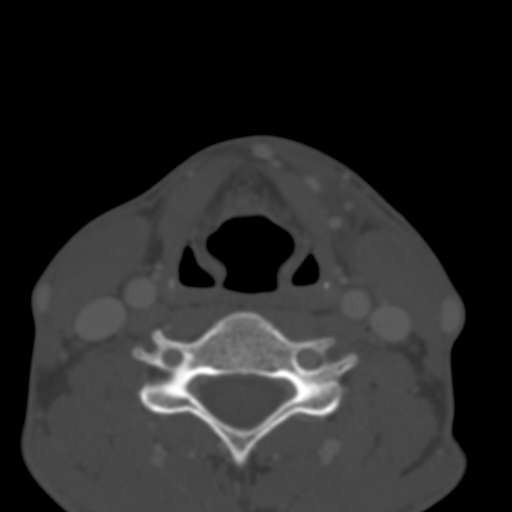
[im 60/90  bone]
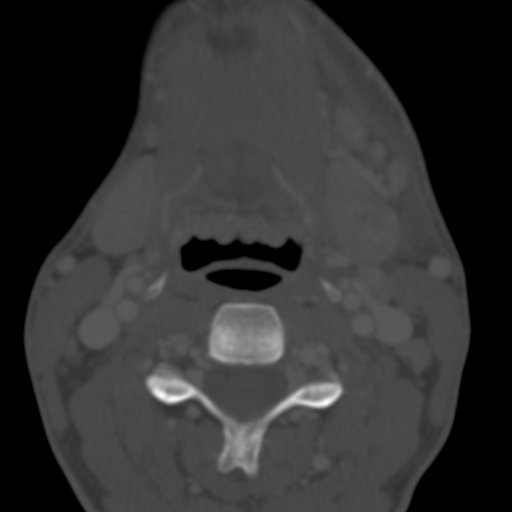
[im 75/90  soft-tissue]
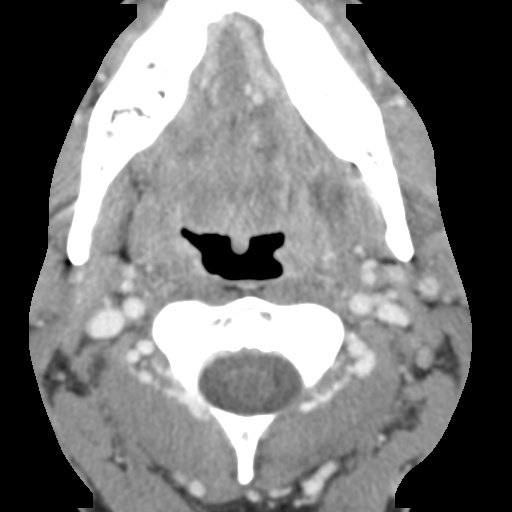
[im 75/90  bone]
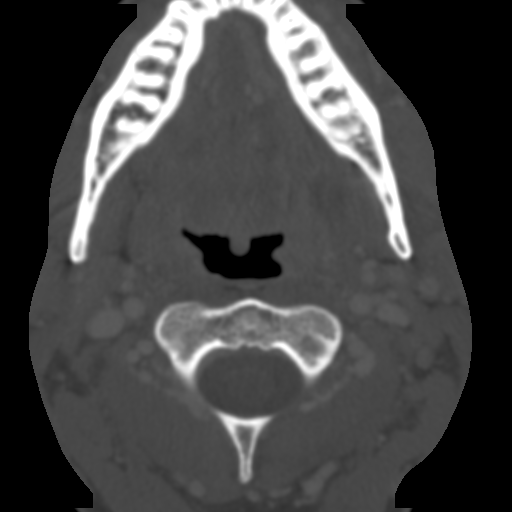

[10 of 14 positions shown; findings below may reference images not displayed]

FINDINGS: Inflammation centered in the left submandibular region secondary to
odontogenic infection. Specifically, the terminal mandibular molar
on the left has a large cavity with periapical abscess which is
decompressed through the inner cortex of the mandible creating a
subperiosteal abscess. Abscess is 3.5 cm in length, but only 6 mm in
maximal thickness/depth. It tracks anteriorly from the site of
cortical breach. Reactive, non suppurative lymphadenopathy. Mild
enlargement of the left submandibular and lingual glands is also
reactive. There is extensive submandibular inflammation, but no
definite floor of mouth inflammation. No emphysematous changes in
the floor of mouth. No venous occlusion.

Numerous cavities aside from the above described.

No evidence of mass along the surfaces of the aerodigestive tract.
Normal thyroid.
IMPRESSION: Odontogenic subperiosteal abscess along the inner left mandible. The
offending tooth is the left lower terminal molar. Associated
inflammatory changes are described above.

## 2016-05-28 ENCOUNTER — Emergency Department (HOSPITAL_COMMUNITY)
Admission: EM | Admit: 2016-05-28 | Discharge: 2016-05-28 | Disposition: A | Discharge status: Home / Self Care | Payer: Self-pay | Attending: Emergency Medicine | Admitting: Emergency Medicine

## 2016-05-28 ENCOUNTER — Encounter (HOSPITAL_COMMUNITY): Payer: Self-pay | Admitting: Emergency Medicine

## 2016-05-28 DIAGNOSIS — M7661 Achilles tendinitis, right leg: Secondary | ICD-10-CM | POA: Insufficient documentation

## 2016-05-28 DIAGNOSIS — Z79899 Other long term (current) drug therapy: Secondary | ICD-10-CM | POA: Insufficient documentation

## 2016-05-28 DIAGNOSIS — F17200 Nicotine dependence, unspecified, uncomplicated: Secondary | ICD-10-CM | POA: Insufficient documentation

## 2016-05-28 NOTE — ED Provider Notes (Signed)
[facility]-EMERGENCY DEPT Provider Note   By signing my name below, I, Earmon Phoenix, attest that this documentation has been prepared under the direction and in the presence of Audry Pili, PA-C. Electronically Signed: Earmon Phoenix, ED Scribe. 05/28/16. 3:50 PM.    History   Chief Complaint No chief complaint on file.  The history is provided by the patient and medical records. No language interpreter was used.    Paul Wolfe is a 36 y.o. male who presents to the Emergency Department complaining of improving right ankle pain located at the achilles tendon that began yesterday. He reports associated swelling to the posterior ankle and medial aspect of his right foot. 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 He has been elevating and icing the ankle as well as taking Aleve for the pain with moderate relief. Walking increases the pain. Resting the ankle helps alleviate the pain. He denies fever, chills, nausea, vomiting, bruising, wounds, numbness, tingling or weakness of the RLE or foot. He states the pain is much better than yesterday and his supervisor is requesting a doctor's note.  Past Medical History:  Diagnosis Date  . GERD (gastroesophageal reflux disease)    takes tums every now and then    Patient Active Problem List   Diagnosis Date Noted  . Submandibular space infection 01/16/2014    Past Surgical History:  Procedure Laterality Date  . INCISION AND DRAINAGE ABSCESS Left 01/16/2014   Procedure: INCISION AND DRAINAGE ABSCESS;  Surgeon: Georgia Lopes, DDS;  Location: [facility] OR;  Service: Oral Surgery;  Laterality: Left;  . TOOTH EXTRACTION Left 01/16/2014   Procedure: DENTAL RESTORATION/EXTRACTIONS;  Surgeon: Georgia Lopes, DDS;  Location: [facility] OR;  Service: Oral Surgery;  Laterality: Left;  . WRIST SURGERY Left        Home Medications    Prior to Admission medications   Medication Sig Start Date End  Date Taking? Authorizing Provider  acetaminophen (TYLENOL) 325 MG tablet Take 650 mg by mouth every 6 (six) hours as needed for moderate pain.    Historical Provider, MD  amoxicillin-clavulanate (AUGMENTIN) 875-125 MG per tablet Take 1 tablet by mouth 2 (two) times daily. 01/18/14   Ocie Doyne, DDS  chlorhexidine (PERIDEX) 0.12 % solution Use as directed 15 mLs in the mouth or throat 2 (two) times daily. 01/18/14   Ocie Doyne, DDS  ibuprofen (ADVIL,MOTRIN) 200 MG tablet Take 400 mg by mouth every 6 (six) hours as needed for moderate pain.    Historical Provider, MD  ondansetron (ZOFRAN) 4 MG tablet Take 1 tablet (4 mg total) by mouth every 8 (eight) hours as needed for nausea or vomiting. 01/18/14   Ocie Doyne, DDS  oxyCODONE-acetaminophen (PERCOCET) 5-325 MG per tablet Take 1-2 tablets by mouth every 4 (four) hours as needed for severe pain. 01/18/14   Ocie Doyne, DDS    Family History No family history on file.  Social History Social History  Substance Use Topics  . Smoking status: Current Every Day Smoker  . Smokeless tobacco: Not on file  . Alcohol use 12.0 oz/week    20 Cans of beer per week     Allergies   Patient has no known allergies.   Review of Systems Review of Systems  Musculoskeletal: Positive for arthralgias and joint swelling.  Skin: Negative for color change and wound.  Neurological: Negative for weakness and numbness.     Physical Exam Updated Vital  Signs BP 136/92   Pulse 96   Temp 98.4 F (36.9 C)   Resp 16   SpO2 99%   Physical Exam  Constitutional: He is oriented to person, place, and time. Vital signs are normal. He appears well-developed and well-nourished.  HENT:  Head: Normocephalic and atraumatic.  Right Ear: Hearing normal.  Left Ear: Hearing normal.  Eyes: Conjunctivae and EOM are normal. Pupils are equal, round, and reactive to light.  Neck: Normal range of motion.  Cardiovascular: Normal rate and regular rhythm.   Distal  pulses of RLE appreciated.  Pulmonary/Chest: Effort normal.  Musculoskeletal: Normal range of motion.  Achilles tendon of RLE intact. ROM intact. Distal pulses appreciated. No obvious swelling or erythema  Neurological: He is alert and oriented to person, place, and time.  NVI  Skin: Skin is warm and dry.  Psychiatric: He has a normal mood and affect. His speech is normal and behavior is normal. Thought content normal.  Nursing note and vitals reviewed.  ED Treatments / Results    COORDINATION OF CARE: 3:45 PM- Will provide ASO brace. Encouraged pt to take OTC Motrin or Aleve for inflammation. Pt verbalizes understanding and agrees to plan.  Medications - No data to display  Labs (all labs ordered are listed, but only abnormal results are displayed) Labs Reviewed - No data to display  EKG  EKG Interpretation None       Radiology No results found.  Procedures Procedures (including critical care time)  Medications Ordered in ED Medications - No data to display   Initial Impression / Assessment and Plan / ED Course  I have reviewed the triage vital signs and the nursing notes.  Pertinent labs & imaging results that were available during my care of the patient were reviewed by me and considered in my medical decision making (see chart for details).     Final Clinical Impressions(s) / ED Diagnoses  I have reviewed the relevant previous healthcare records. I obtained HPI from historian.  ED Course:  Assessment: Patient here for improving right posterior ankle pain that began yesterday. Imaging not indicated at this time. He denies trauma, injury or fall but reports overexerting it while working yesterday. Pt advised to follow up with PCP. Patient given ASO brace while in ED, conservative therapy recommended and discussed. Likely achilles tendonitis. Counseled on NSAIDs, rest and ice.  Patient will be discharged home & is agreeable with above plan. Returns precautions  discussed. Pt appears safe for discharge.   Disposition/Plan:  D/c home Additional Verbal discharge instructions given and discussed with patient.  Pt Instructed to f/u with PCP in the next week for evaluation and treatment of symptoms. Return precautions given Pt acknowledges and agrees with plan  Supervising Physician Charlynne Panderavid Hsienta Yao, MD  Final diagnoses:  Achilles tendinitis of right lower extremity   I personally performed the services described in this documentation, which was scribed in my presence. The recorded information has been reviewed and is accurate.   New Prescriptions New Prescriptions   No medications on file     Audry Piliyler Camrynn Mcclintic, PA-C 05/28/16 1554    Charlynne Panderavid Hsienta Yao, MD 05/28/16 [phone]

## 2016-05-28 NOTE — ED Triage Notes (Signed)
Pt reports pain in r/ankle after a long work day.Pt reports swelling of r/ankle last night that responded to ice Aleve

## 2016-05-28 NOTE — Discharge Instructions (Signed)
Please read and follow all provided instructions.  Your diagnoses today include:  1. Achilles tendinitis of right lower extremity     Tests performed today include: Vital signs. See below for your results today.   Medications prescribed:  Take as prescribed   Home care instructions:  Follow any educational materials contained in this packet.  Follow-up instructions: Please follow-up with your primary care provider for further evaluation of symptoms and treatment   Return instructions:  Please return to the Emergency Department if you do not get better, if you get worse, or new symptoms OR  - Fever (temperature greater than 101.71F)  - Bleeding that does not stop with holding pressure to the area    -Severe pain (please note that you may be more sore the day after your accident)  - Chest Pain  - Difficulty breathing  - Severe nausea or vomiting  - Inability to tolerate food and liquids  - Passing out  - Skin becoming red around your wounds  - Change in mental status (confusion or lethargy)  - New numbness or weakness    Please return if you have any other emergent concerns.  Additional Information:  Your vital signs today were: There were no vitals taken for this visit. If your blood pressure (BP) was elevated above 135/85 this visit, please have this repeated by your doctor within one month. ---------------
